# Patient Record
Sex: Male | Born: 1960 | Race: Black or African American | Hispanic: No | Marital: Single
Health system: Southern US, Community
[De-identification: ages and names within clinical notes are randomized; demographics above are authoritative.]

---

## 2003-06-30 ENCOUNTER — Emergency Department (HOSPITAL_COMMUNITY): Admission: EM | Admit: 2003-06-30 | Discharge: 2003-06-30 | Payer: Self-pay | Admitting: Emergency Medicine

## 2005-05-10 ENCOUNTER — Emergency Department (HOSPITAL_COMMUNITY): Admission: EM | Admit: 2005-05-10 | Discharge: 2005-05-11 | Payer: Self-pay | Admitting: Emergency Medicine
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
## 2018-03-30 ENCOUNTER — Other Ambulatory Visit: Payer: Self-pay

## 2018-03-30 ENCOUNTER — Emergency Department (HOSPITAL_COMMUNITY)
Admission: EM | Admit: 2018-03-30 | Discharge: 2018-03-30 | Disposition: A | Discharge status: Home / Self Care | Payer: Self-pay | Attending: Emergency Medicine | Admitting: Emergency Medicine

## 2018-03-30 ENCOUNTER — Emergency Department (HOSPITAL_COMMUNITY): Payer: Self-pay

## 2018-03-30 DIAGNOSIS — Y999 Unspecified external cause status: Secondary | ICD-10-CM | POA: Insufficient documentation

## 2018-03-30 DIAGNOSIS — S2232XA Fracture of one rib, left side, initial encounter for closed fracture: Secondary | ICD-10-CM | POA: Insufficient documentation

## 2018-03-30 DIAGNOSIS — Y93E9 Activity, other interior property and clothing maintenance: Secondary | ICD-10-CM | POA: Insufficient documentation

## 2018-03-30 DIAGNOSIS — Y92009 Unspecified place in unspecified non-institutional (private) residence as the place of occurrence of the external cause: Secondary | ICD-10-CM | POA: Insufficient documentation

## 2018-03-30 DIAGNOSIS — W01198A Fall on same level from slipping, tripping and stumbling with subsequent striking against other object, initial encounter: Secondary | ICD-10-CM | POA: Insufficient documentation

## 2018-03-30 MED ORDER — OXYCODONE-ACETAMINOPHEN 5-325 MG PO TABS: 1.0000 | ORAL_TABLET | Freq: Four times a day (QID) | ORAL | 0 refills | Status: AC | PRN

## 2018-03-30 MED ORDER — OXYCODONE-ACETAMINOPHEN 5-325 MG PO TABS: 1.0000 | ORAL_TABLET | Freq: Four times a day (QID) | ORAL | 0 refills | Status: DC | PRN

## 2018-03-30 NOTE — ED Provider Notes (Signed)
[facility]-EMERGENCY DEPT Provider Note   CSN: [id number] Arrival date & time: 03/30/18  0940     History   Chief Complaint Chief Complaint  Patient presents with  . Fall  . Flank Pain    HPI Jonnie Kindierre Morell is a 57 y.o. male.  HPI  57 year old male with left-sided chest pain.  Last Tuesday he was cleaning a storage room when he lost his balance and fell onto cans of paint.  He struck the left side of his chest.  He has been having persistent pain since then.  It is worse with certain movements, deep breathing and coughing.  He has been taking ibuprofen which does help.  Is presenting today because his pain is not really improved all that much since this happened.  He does not feel short of breath.  He denies any other injuries.    No past medical history on file.  There are no active problems to display for this patient.   Home Medications    Prior to Admission medications   Not on File    Family History No family history on file.  Social History Social History   Tobacco Use  . Smoking status: Not on file  Substance Use Topics  . Alcohol use: Not on file  . Drug use: Not on file     Allergies   Patient has no allergy information on record.   Review of Systems Review of Systems  All systems reviewed and negative, other than as noted in HPI.  Physical Exam Updated Vital Signs BP (!) 179/107   Pulse (!) 59   Temp 97.6 F (36.4 C) (Oral)   Resp 18   Ht 5\' 10"  (1.778 m)   Wt 88.5 kg   SpO2 98%   BMI 27.98 kg/m   Physical Exam  Constitutional: He appears well-developed and well-nourished. No distress.  HENT:  Head: Normocephalic and atraumatic.  Eyes: Conjunctivae are normal. Right eye exhibits no discharge. Left eye exhibits no discharge.  Neck: Neck supple.  Cardiovascular: Normal rate, regular rhythm and normal heart sounds. Exam reveals no gallop and no friction rub.  No murmur heard. Pulmonary/Chest: Effort normal and  breath sounds normal. No respiratory distress. He exhibits tenderness.  Left chest wall tenderness in the anterior to lateral chest wall.  No overlying skin changes.  No crepitus.  Breath sounds are clear and symmetric bilaterally.  No midline spinal tenderness.  Abdominal: Soft. He exhibits no distension. There is no tenderness.  Musculoskeletal: He exhibits no edema or tenderness.  Neurological: He is alert.  Skin: Skin is warm and dry.  Psychiatric: He has a normal mood and affect. His behavior is normal. Thought content normal.  Nursing note and vitals reviewed.    ED Treatments / Results  Labs (all labs ordered are listed, but only abnormal results are displayed) Labs Reviewed - No data to display  EKG None  Radiology Dg Ribs Unilateral W/chest Left  Result Date: 03/30/2018 CLINICAL DATA:  Fall with persistent pain in the left chest. Initial encounter. EXAM: LEFT RIBS AND CHEST - 3+ VIEW COMPARISON:  05/10/2005 chest x-ray FINDINGS: Nondisplaced lateral left seventh rib fracture best seen on oblique radiograph. No pneumothorax or hemothorax. Chronic metallic foreign body over the right chest. Normal heart size. IMPRESSION: Nondisplaced left seventh rib fracture. No evidence of intrathoracic injury. Electronically Signed   By: Marnee SpringJonathon  Watts M.D.   On: 03/30/2018 11:01    Procedures Procedures (including critical care time)  Definitve Fracture Care. Closed treatment of uncomplicated rib fracture: 1 (one):  Definitive fracture care was provided for a single closed rib fracture. Treatment including pain medication, discussion of possible complications including pneumonia, discussion of usage of incentive spirometry, and referral to primary care physician (or resource list provided) for further followup.  Medications Ordered in ED Medications - No data to display   Initial Impression / Assessment and Plan / ED Course  I have reviewed the triage vital signs and the nursing  notes.  Pertinent labs & imaging results that were available during my care of the patient were reviewed by me and considered in my medical decision making (see chart for details).     57 year old male with persistent left chest pain after mechanical fall.  Imaging significant for a nondisplaced left seventh rib fracture.  No pneumothorax.  Vitals reassuring.  Plan continued NSAIDs as needed.  Will prescribe a small amount of Percocet to take as needed as well.  Incentive spirometry.  Return precautions discussed.  Final Clinical Impressions(s) / ED Diagnoses   Final diagnoses:  Closed fracture of one rib of left side, initial encounter    ED Discharge Orders    None       Raeford Razor, MD 03/30/18 1115

## 2018-03-30 NOTE — Discharge Instructions (Addendum)
You fractured (broke) you L seventh rib. It is non-displaced meaning the edges are still lined up well, similar to cracking a dinner plated. Rib fractures take weeks to heal. Keep taking ibuprofen. You can take 600 mg every 6 hours as needed. Take percocet for the times when this is not quite enough. IT is safe to take them both concurrently. Try to use the incentive spirometer every 1-2 hours while you're awake for at least the next week but preferably two.

## 2018-03-30 NOTE — ED Notes (Signed)
Patient transported to X-ray 

## 2018-03-30 NOTE — ED Triage Notes (Signed)
Pt reports moving paint cans last Tuesday (11/26) when he tripped and fell onto them, hitting his left side on the paint cans.  Reports ongoing pain since. Pt reports taking 2 Tylenol at about 0800 which provided some relief.

## 2019-10-02 IMAGING — CR DG RIBS W/ CHEST 3+V*L*
3 series · 3 of 3 positions shown · non-contrast
Comparison: 05/10/2005 chest x-ray

CLINICAL DATA: Fall with persistent pain in the left chest. Initial
encounter.

EXAM:
LEFT RIBS AND CHEST - 3+ VIEW

[w chest pa]
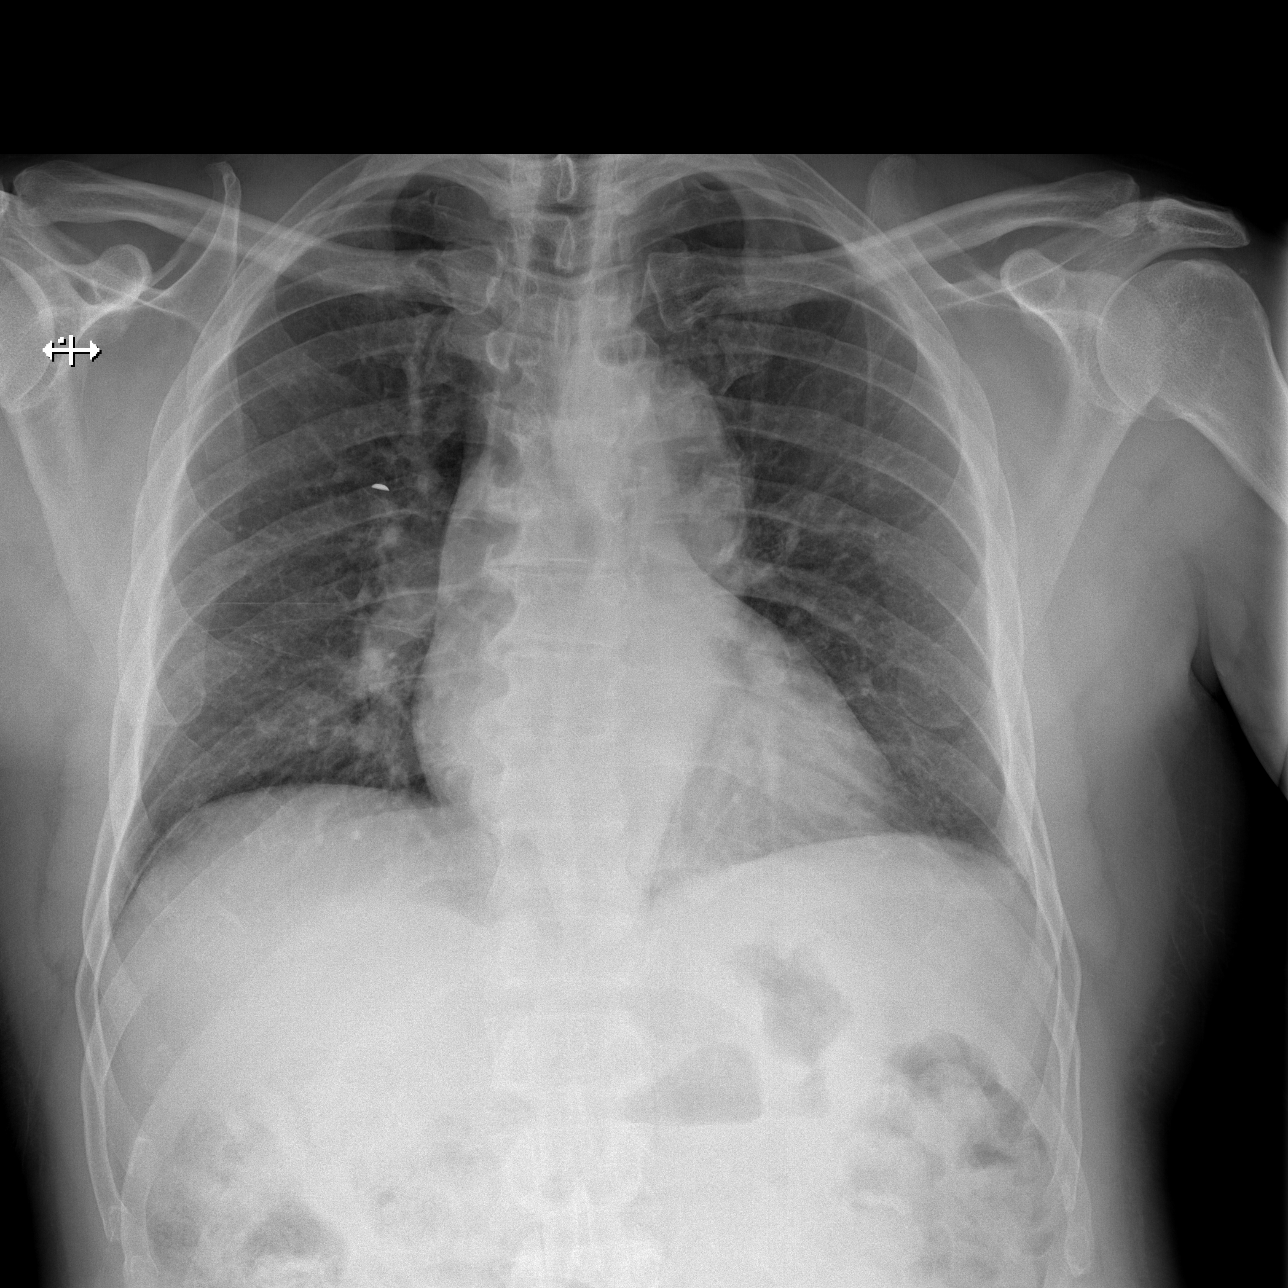

[w ribs ap upper left]
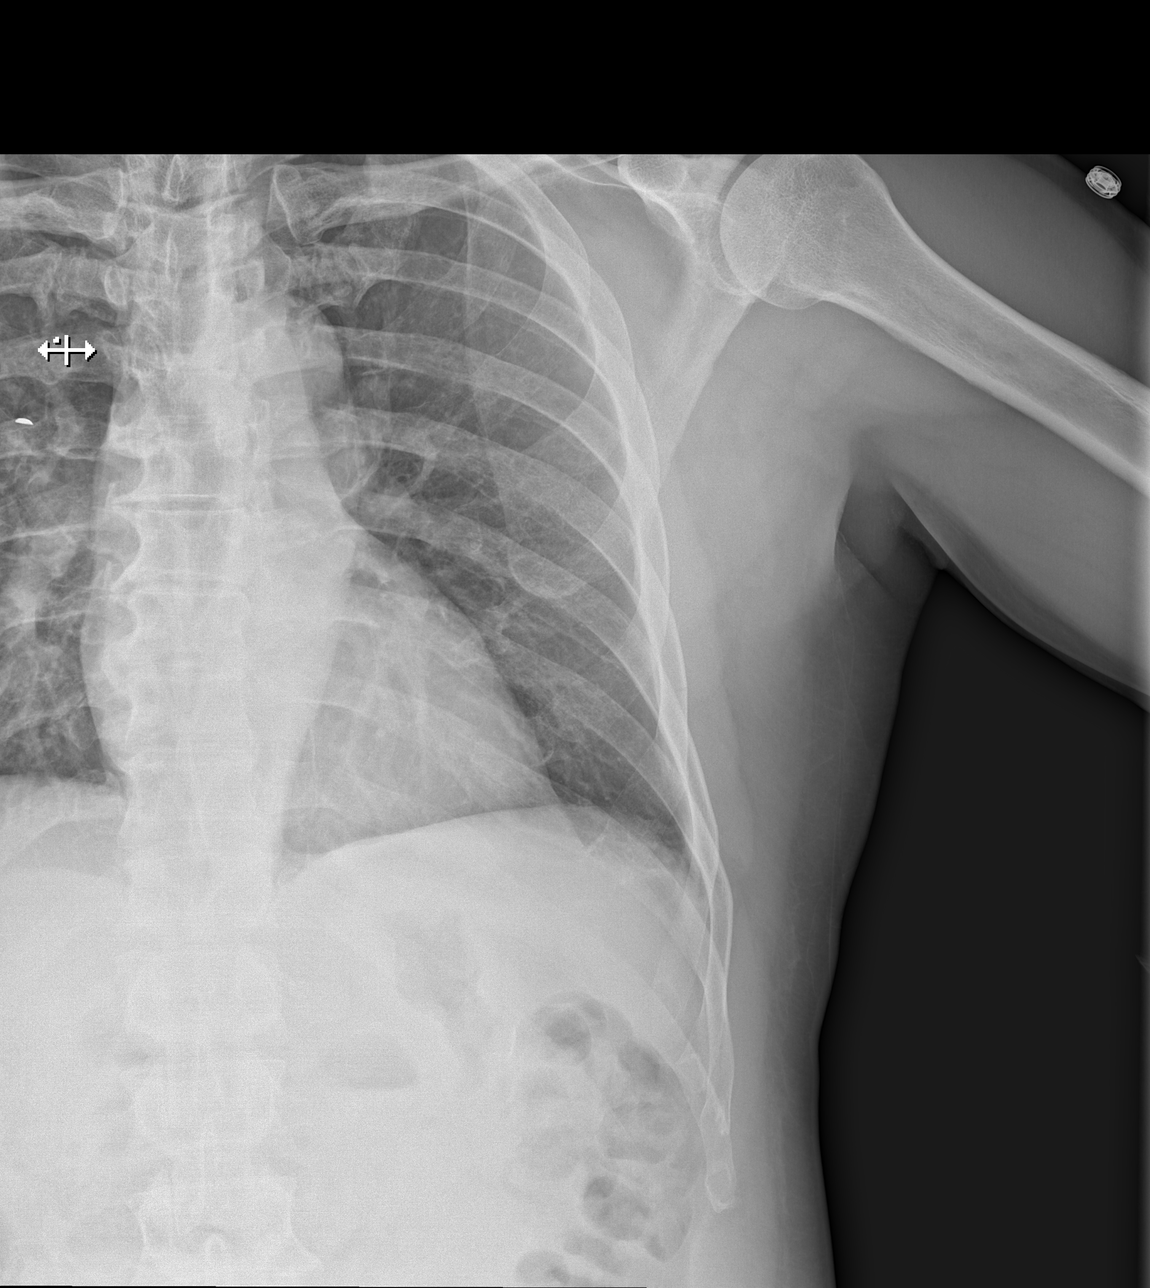

[w ribs obl left]
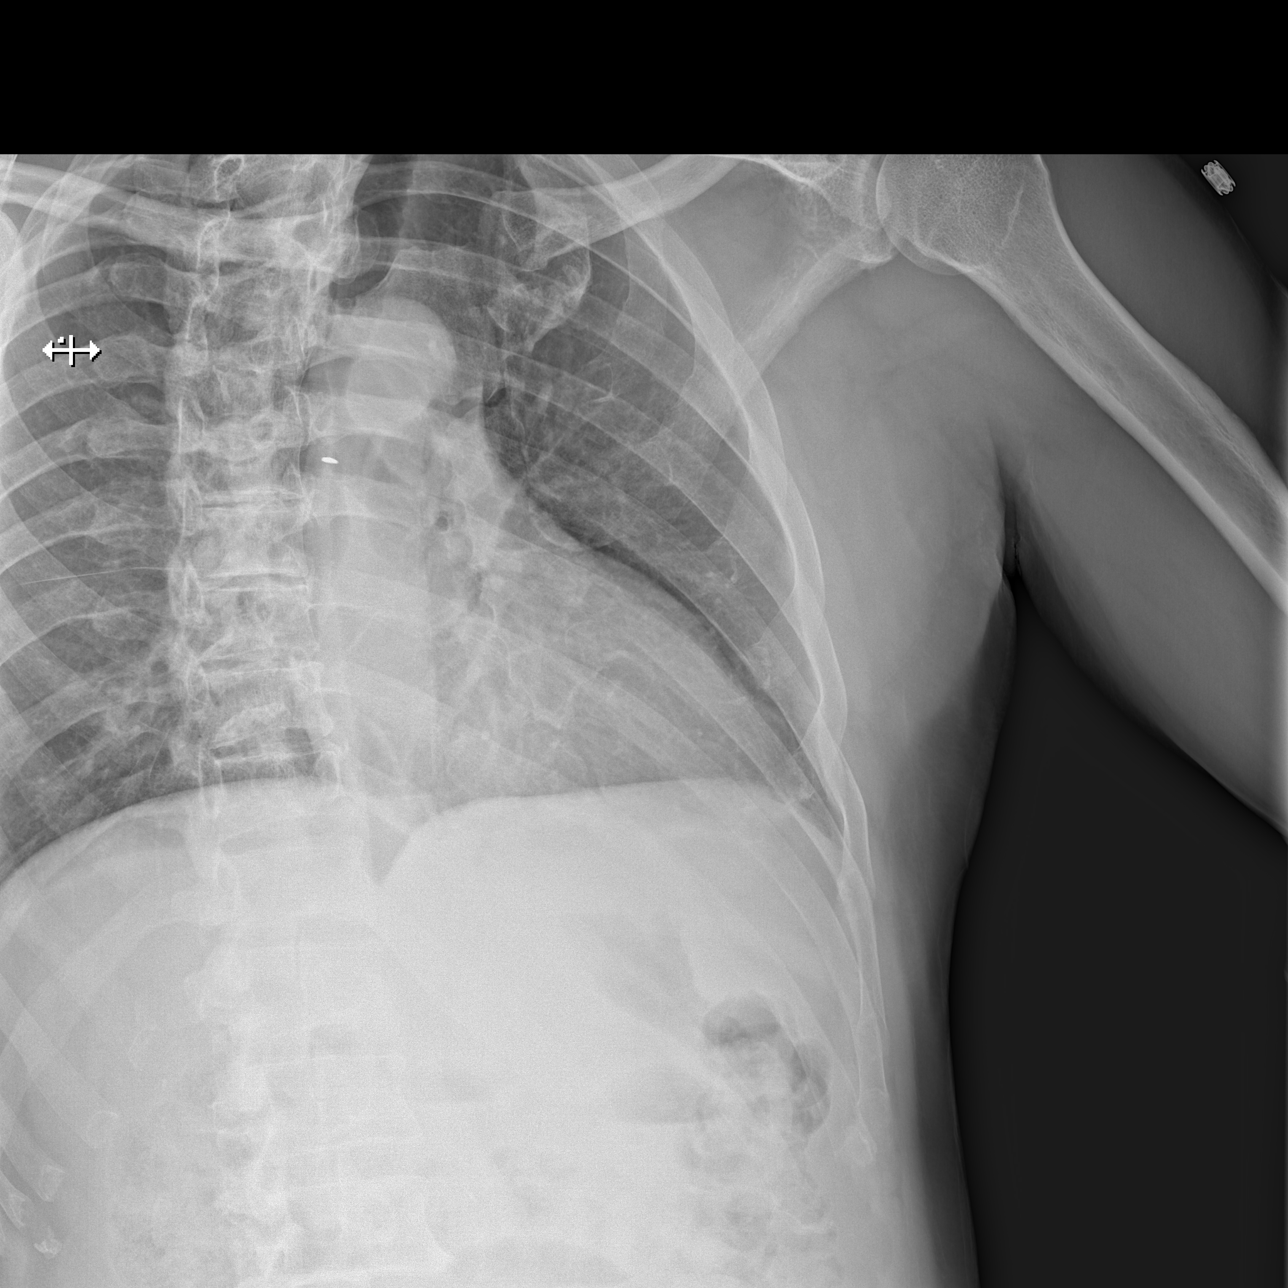

[3 of 3 positions shown; findings below may reference images not displayed]

FINDINGS: Nondisplaced lateral left seventh rib fracture best seen on oblique
radiograph. No pneumothorax or hemothorax.

Chronic metallic foreign body over the right chest. Normal heart
size.
IMPRESSION: Nondisplaced left seventh rib fracture.

No evidence of intrathoracic injury.
